# Patient Record
Sex: Female | Born: 1965 | Race: White | Hispanic: No | Smoking: Current every day smoker
Health system: Southern US, Community
[De-identification: ages and names within clinical notes are randomized; demographics above are authoritative.]

---

## 2008-02-16 IMAGING — US US OUTSIDE FILMS BREAST
1 series · 5 of 5 positions shown · non-contrast
Comparison: none

[Series 1: us outside films breast · 5 of 5 slices shown]
[im 1/5]
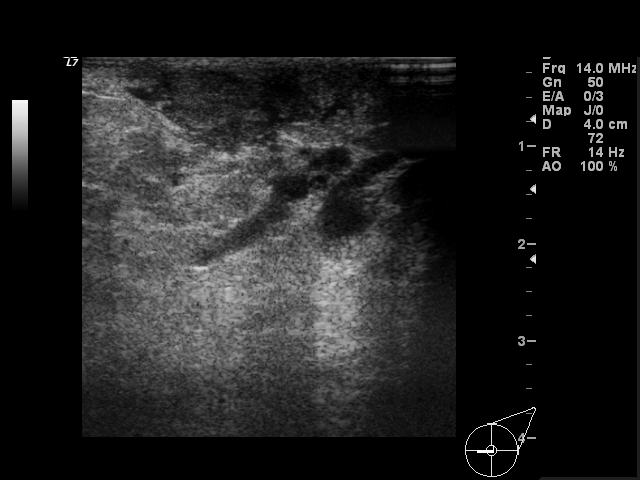
[im 2/5]
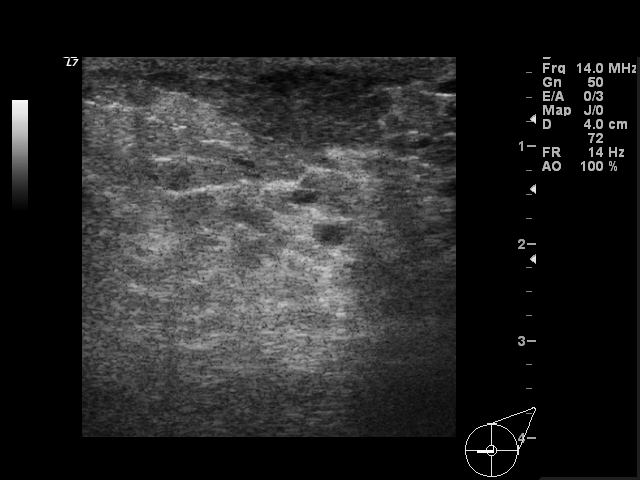
[im 3/5]
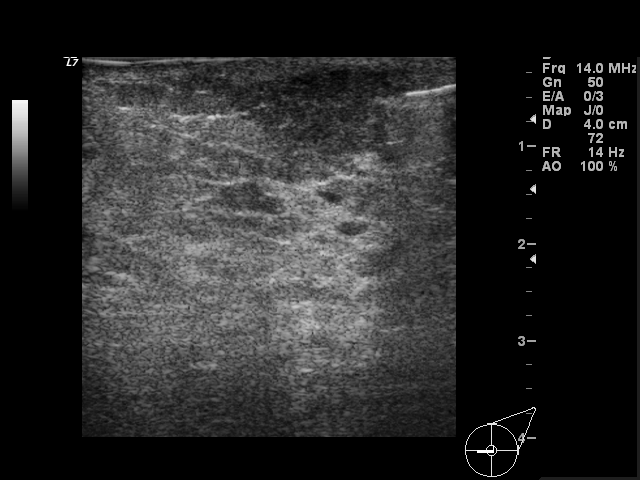
[im 4/5]
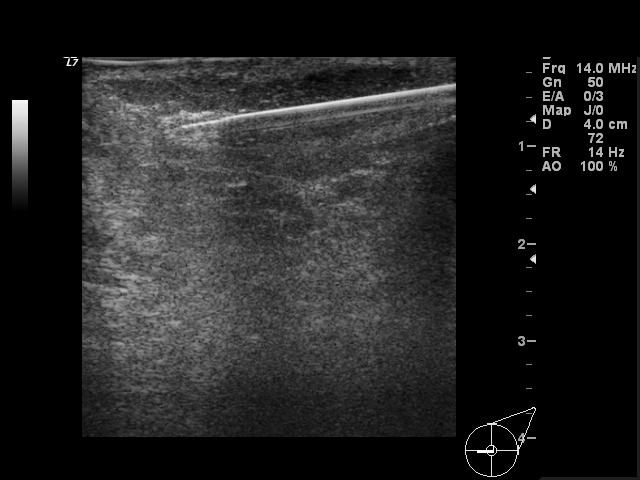
[im 5/5]
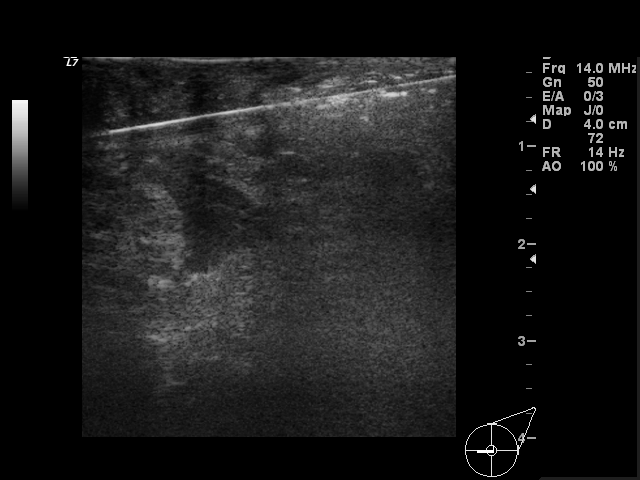

[5 of 5 positions shown; findings below may reference images not displayed]

IMAGES IMPORTED FROM THE SYNGO WORKFLOW SYSTEM
NO DICTATION FOR STUDY

## 2010-03-14 ENCOUNTER — Ambulatory Visit: Payer: Self-pay | Admitting: Internal Medicine

## 2011-06-23 ENCOUNTER — Ambulatory Visit: Payer: Self-pay | Admitting: Internal Medicine

## 2011-06-28 ENCOUNTER — Ambulatory Visit: Payer: Self-pay

## 2012-11-22 IMAGING — US ULTRASOUND LEFT BREAST
1 series · 17 of 24 positions shown · non-contrast
Comparison: none

REASON FOR EXAM: LT BRST LUMP NIPPLE AREA KATUMBE RIPUNDA [phone]
COMMENTS:

PROCEDURE:     US  - US BREAST LEFT  - June 28, 2011 [DATE]
RESULT:     COMPARISON:  09/18/2006 from [REDACTED]
TECHNIQUE: Digital diagnostic mammograms were obtained. FDA approved
computer-aided detection (CAD) for mammography was utilized for this study.
Real-time sonography of the periareolar and retroareolar left breast was
performed at the site of clinical palpable abnormality at approximately the
[DATE] position.

[Series 1: ultrasound left breast · 17 of 24 slices shown]
[im 1/24]
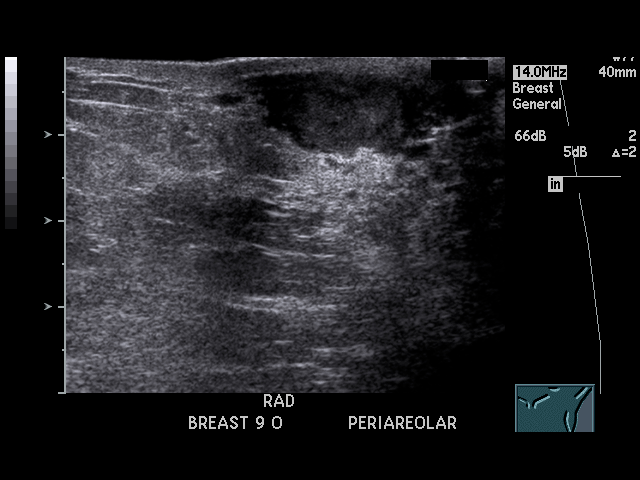
[im 3/24]
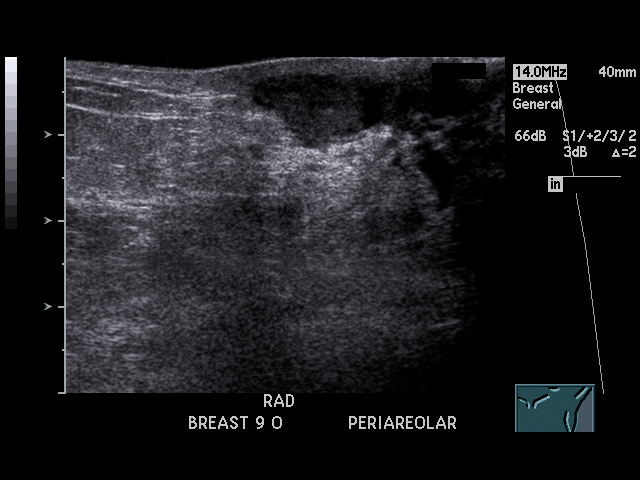
[im 4/24]
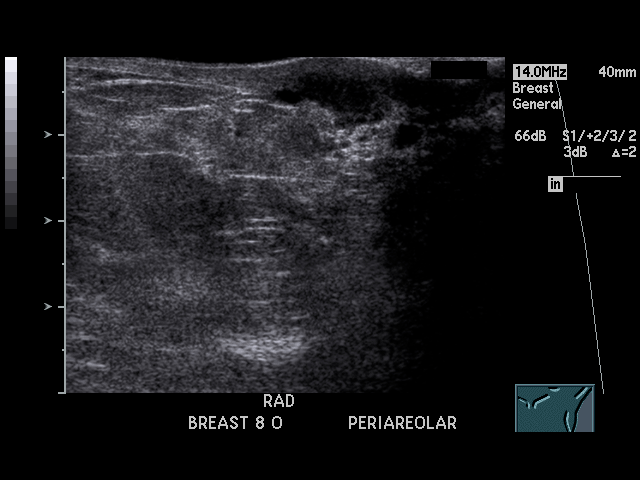
[im 5/24]
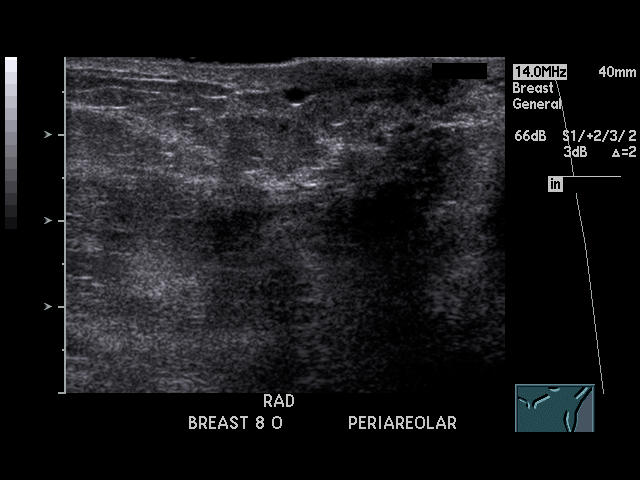
[im 7/24]
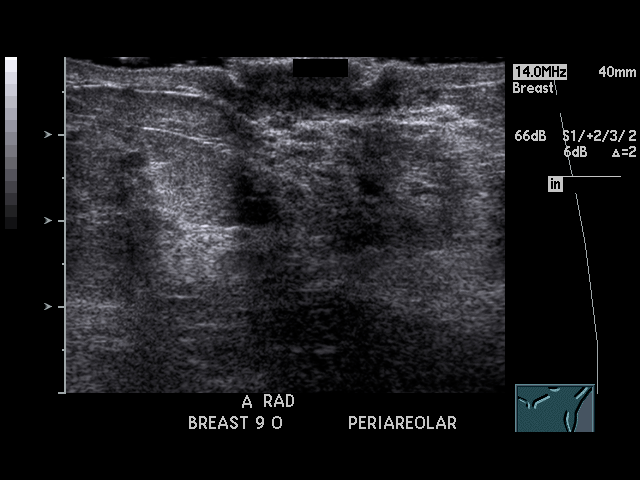
[im 8/24]
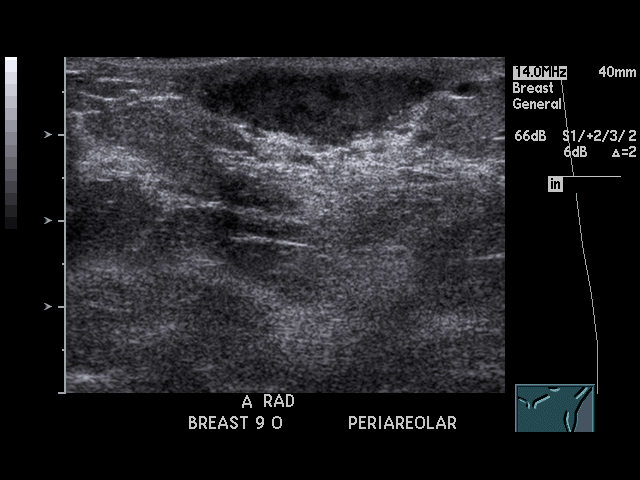
[im 10/24]
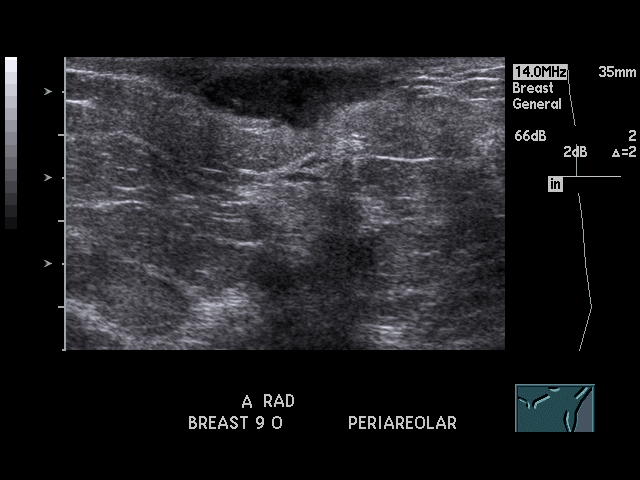
[im 11/24]
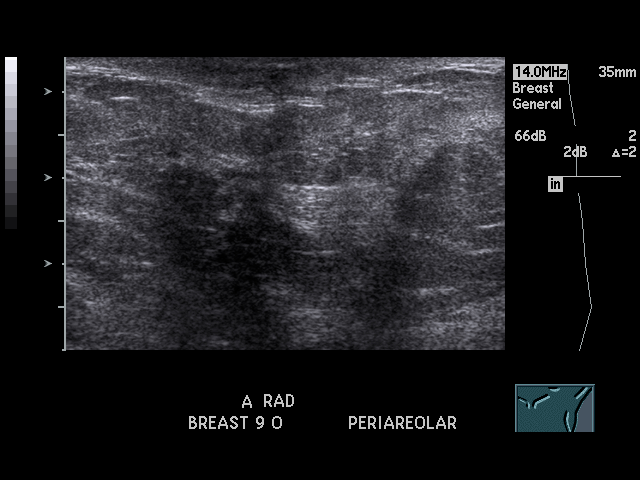
[im 13/24]
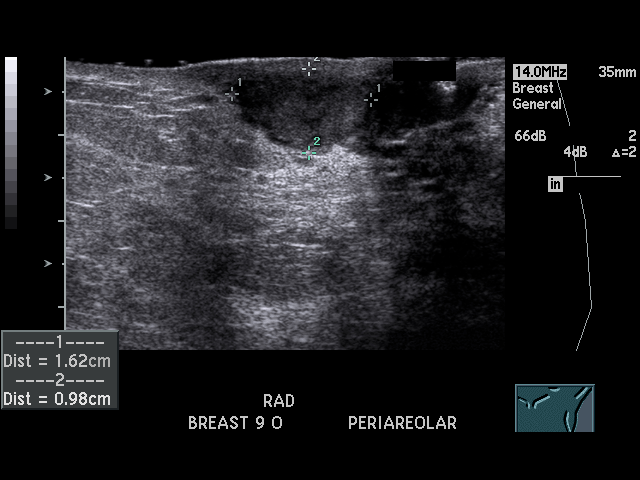
[im 14/24]
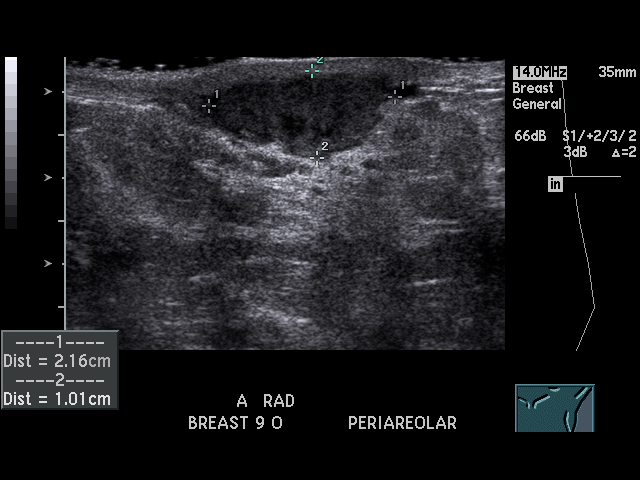
[im 15/24]
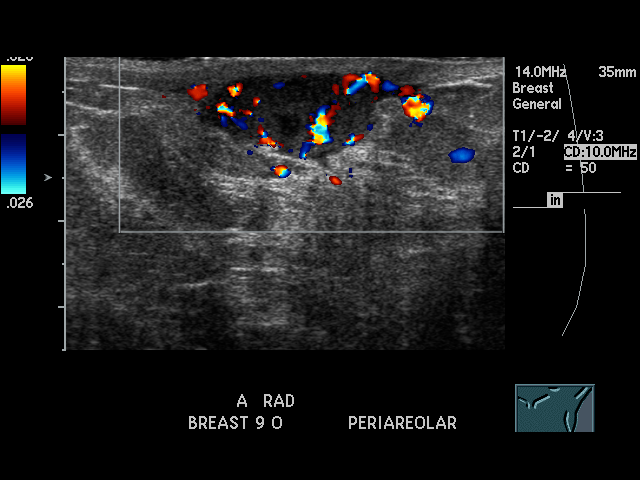
[im 17/24]
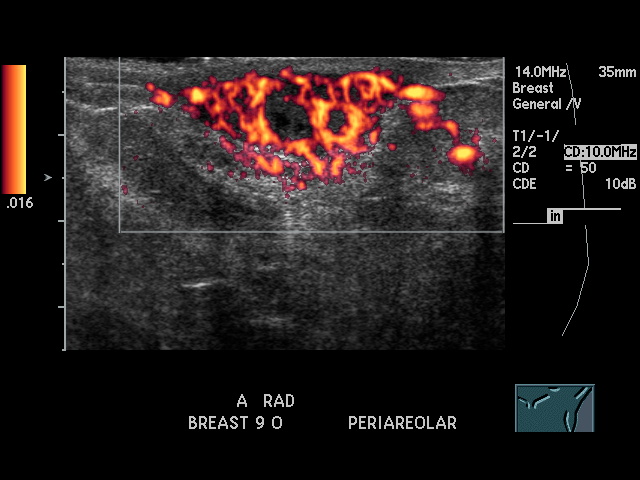
[im 18/24]
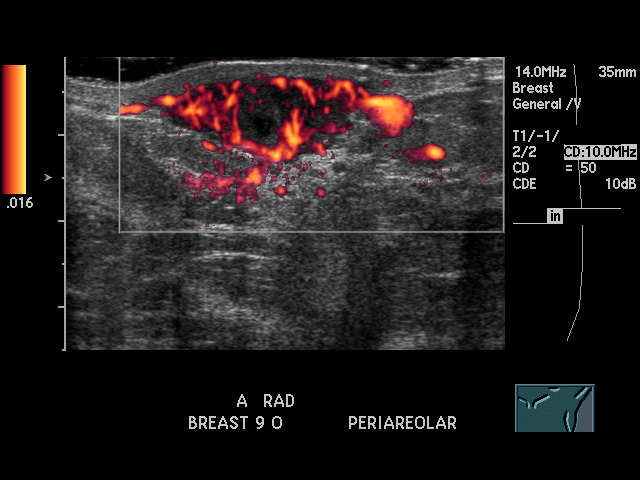
[im 20/24]
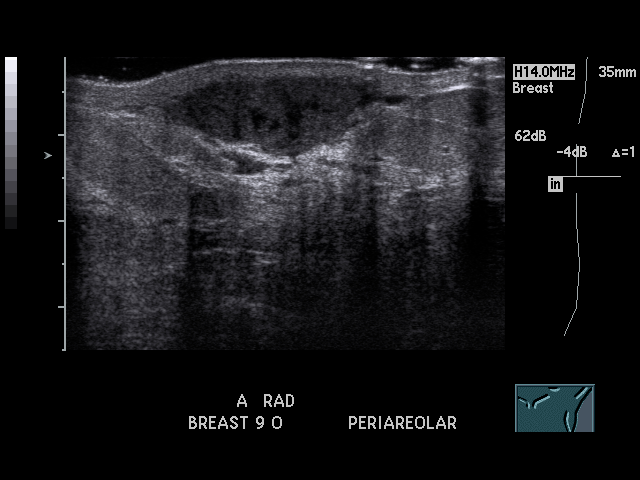
[im 21/24]
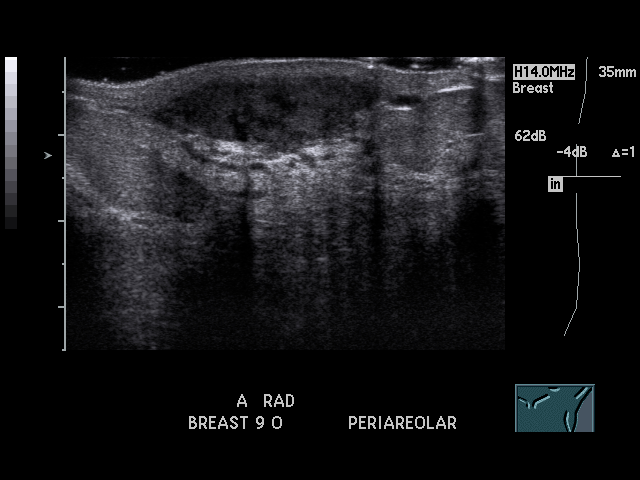
[im 22/24]
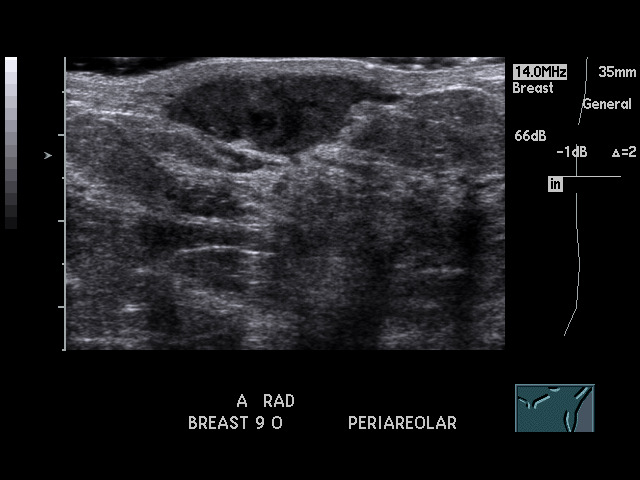
[im 24/24]
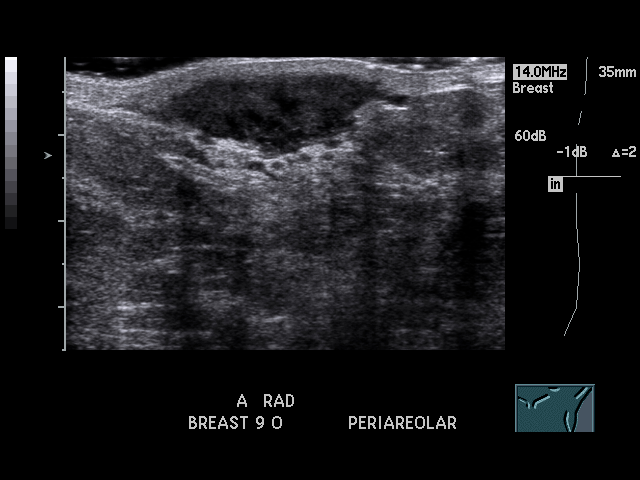

[17 of 24 positions shown; findings below may reference images not displayed]

FINDING: Bilateral breasts are heterogeneously dense which may lower the sensitivity
of mammography. There is slight soft tissue prominence in the left
retroareolar region. There is no other dominant mass, architectural
distortion or clusters of suspicious microcalcifications.

Further evaluation was performed with real-time sonography of the
retroareolar/periareolar left breast at the [DATE] position. In the
periareolar aspect of the left breast at the [DATE] position there is a 2.2 x
1 x 1.6 cm heterogeneously hypoechoic mass with internal Doppler flow. The
margins are well circumscribed. The mass is wider than taller. There is no
significant increased through transmission or posterior acoustic shadowing.
IMPRESSION: 1.     There is a indeterminate mass in the periareolar aspect of the left
breast at the [DATE] position. Tissue diagnosis is recommended.

BI-RADS:  Category 4 - Suspicious Abnormality

A negative mammogram report does not preclude biopsy or other evaluation of
a clinically palpable or otherwise suspicious mass or lesion. Breast cancer
may not be detected by mammography in up to 10% of cases.

[REDACTED]

## 2017-03-07 ENCOUNTER — Encounter: Payer: Self-pay | Admitting: Emergency Medicine

## 2017-03-07 ENCOUNTER — Ambulatory Visit
Admission: EM | Admit: 2017-03-07 | Discharge: 2017-03-07 | Disposition: A | Discharge status: Home / Self Care | Payer: Commercial Managed Care - PPO | Attending: Family Medicine | Admitting: Family Medicine

## 2017-03-07 DIAGNOSIS — M25511 Pain in right shoulder: Secondary | ICD-10-CM

## 2017-03-07 DIAGNOSIS — M75101 Unspecified rotator cuff tear or rupture of right shoulder, not specified as traumatic: Secondary | ICD-10-CM

## 2017-03-07 MED ORDER — PREDNISONE 10 MG (21) PO TBPK: ORAL_TABLET | Freq: Every day | ORAL | 0 refills | Status: DC

## 2017-03-07 NOTE — Discharge Instructions (Signed)
This seems to be from bursitis/rotator cuff tendinitis (and possible tear).  Steroid as prescribed.  Please follow-up with your primary.  Take care  Dr. Adriana Simasook

## 2017-03-07 NOTE — ED Provider Notes (Signed)
[facility]    CSN: [id number] Arrival date & time: 03/07/17  1013   History   Chief Complaint Chief Complaint  Patient presents with  . Arm Pain    right   HPI  [age] year old female presents with right shoulder pain.  Patient states she has had intermittent pain of her right arm/right shoulder since the summer.  She states that it recently worsened/recurred on Sunday.  She reports markedly decreased range of motion, particularly with abduction.  Worse with range of motion.  She been taking Tylenol with no relief.  Her pain is severe, 8-9/10 in severity.  At this point time she has no associated numbness or tingling.  She reports no neck pain.  She does have some pain in the trapezius region.  No other associated symptoms.  No other complaints at this time.  History reviewed. No pertinent past medical history.  History reviewed. No pertinent surgical history.  OB History    No data available     Home Medications    Prior to Admission medications   Medication Sig Start Date End Date Taking? Authorizing Provider  ibuprofen (ADVIL,MOTRIN) 600 MG tablet Take 600 mg every 6 (six) hours as needed by mouth.   Yes [provider]  predniSONE (STERAPRED UNI-PAK 21 TAB) 10 MG (21) TBPK tablet Take daily by mouth. Take 6 tabs by mouth daily  for 2 days, then 5 tabs for 2 days, then 4 tabs for 2 days, then 3 tabs for 2 days, 2 tabs for 2 days, then 1 tab by mouth daily for 2 days 03/07/17   Tommie Samsook, Olin Gurski G, DO   Family History Family History  Problem Relation Age of Onset  . Cancer Mother    Social History Social History   Tobacco Use  . Smoking status: Current Every Day Smoker    Packs/day: 1.00    Types: Cigarettes  . Smokeless tobacco: Never Used  Substance Use Topics  . Alcohol use: Yes  . Drug use: No     Allergies   Patient has no known allergies.   Review of Systems Review of Systems  Musculoskeletal:       Right shoulder pain.  All other  systems reviewed and are negative.  Physical Exam Triage Vital Signs ED Triage Vitals  Enc Vitals Group     BP 03/07/17 1052 (!) 115/95     Pulse Rate 03/07/17 1052 83     Resp 03/07/17 1052 16     Temp 03/07/17 1052 98.4 F (36.9 C)     Temp Source 03/07/17 1052 Oral     SpO2 03/07/17 1052 100 %     Weight 03/07/17 1049 150 lb (68 kg)     Height 03/07/17 1049 5\' 9"  (1.753 m)     Head Circumference --      Peak Flow --      Pain Score 03/07/17 1049 8     Pain Loc --      Pain Edu? --      Excl. in GC? --    Updated Vital Signs BP (!) 115/95 (BP Location: Left Arm)   Pulse 83   Temp 98.4 F (36.9 C) (Oral)   Resp 16   Ht 5\' 9"  (1.753 m)   Wt 150 lb (68 kg)   SpO2 100%   BMI 22.15 kg/m   Physical Exam  Constitutional: She is oriented to person, place, and time. She appears well-developed. No distress.  HENT:  Head: Normocephalic  and atraumatic.  Nose: Nose normal.  Eyes: Conjunctivae are normal. No scleral icterus.  Neck: Normal range of motion.  No discrete posterior neck pain.  Pulmonary/Chest: Effort normal. No respiratory distress. She has no wheezes. She has no rales.  Musculoskeletal:  Shoulder: Right Inspection reveals no abnormalities, atrophy or asymmetry. Palpation is normal with no tenderness over AC joint or bicipital groove. ROM markedly decreased in all planes, particularly abduction and flexion. Rotator cuff strength -4/5 infraspinatus/teres minor and supraspinatus. Positive Hawkins. Painful arc.   Neurological: She is alert and oriented to person, place, and time. No sensory deficit. She exhibits normal muscle tone.  Skin: Skin is warm. No rash noted.  Psychiatric: She has a normal mood and affect. Her behavior is normal.  Vitals reviewed.    UC Treatments / Results  Labs (all labs ordered are listed, but only abnormal results are displayed) Labs Reviewed - No data to display  EKG  EKG Interpretation None       Radiology No results  found.  Procedures Procedures (including critical care time)  Medications Ordered in UC Medications - No data to display   Initial Impression / Assessment and Plan / UC Course  I have reviewed the triage vital signs and the nursing notes.  Pertinent labs & imaging results that were available during my care of the patient were reviewed by me and considered in my medical decision making (see chart for details).     51 year old female presents with shoulder pain.  Appears to be coming from the rotator cuff.  She is also had some cervical issues previously.  Treating with steroids.  Final Clinical Impressions(s) / UC Diagnoses   Final diagnoses:  Rotator cuff syndrome, right    ED Discharge Orders        Ordered    predniSONE (STERAPRED UNI-PAK 21 TAB) 10 MG (21) TBPK tablet  Daily     03/07/17 1145     Controlled Substance Prescriptions  Controlled Substance Registry consulted? Not Applicable   Tommie SamsCook, [place] Prout G, DO 03/07/17 1238

## 2017-03-07 NOTE — ED Triage Notes (Signed)
Patient c/o right arm pain that started a month ago.  Patient was seen at [facility] a month ago for a bone spur.  Patient reports pain in her right shoulder that started on Sunday.

## 2018-05-02 DIAGNOSIS — M0520 Rheumatoid vasculitis with rheumatoid arthritis of unspecified site: Secondary | ICD-10-CM

## 2018-05-02 HISTORY — DX: Rheumatoid vasculitis with rheumatoid arthritis of unspecified site: M05.20

## 2021-10-19 ENCOUNTER — Telehealth: Payer: Self-pay

## 2021-10-19 ENCOUNTER — Ambulatory Visit (INDEPENDENT_AMBULATORY_CARE_PROVIDER_SITE_OTHER): Payer: Self-pay

## 2021-10-19 ENCOUNTER — Ambulatory Visit
Admission: EM | Admit: 2021-10-19 | Discharge: 2021-10-19 | Disposition: A | Discharge status: Home / Self Care | Payer: Self-pay | Attending: Internal Medicine | Admitting: Internal Medicine

## 2021-10-19 DIAGNOSIS — R059 Cough, unspecified: Secondary | ICD-10-CM

## 2021-10-19 DIAGNOSIS — R0789 Other chest pain: Secondary | ICD-10-CM

## 2021-10-19 MED ORDER — FAMCICLOVIR 500 MG PO TABS: 500.0000 mg | ORAL_TABLET | Freq: Three times a day (TID) | ORAL | 0 refills | Status: DC

## 2021-10-19 MED ORDER — FAMCICLOVIR 500 MG PO TABS: 500.0000 mg | ORAL_TABLET | Freq: Three times a day (TID) | ORAL | 0 refills | Status: AC

## 2021-10-19 NOTE — ED Triage Notes (Addendum)
Onset 1 week ago, with left side chest pain. Pt was at the beach last week and thinks she may have pull a muscle.

## 2021-10-19 NOTE — Telephone Encounter (Signed)
Change in pharmacy. Sending her prescription to [facility] pharmacy.

## 2021-10-19 NOTE — Discharge Instructions (Addendum)
No danger signs on exam today.  ECG was unremarkable and chest xray had no left sided findings and no evidence of pneumonia.  There was a tiny lesion in the right lower lung field thought to be scar tissue, that should be rechecked with another chest xray in 6 months.   Possible causes of the left sided chest pain include musculoskeletal (related to beach trip and carrying things/moving a heavy door) and possibly impending shingles.  Prescription for famciclovir (medicine for shingles) was sent to the pharmacy.  Since the skin is so sore, we are treating for possible shingles; watch carefully for rash in the painful areas; blister fluid from the rash is contagious for the chicken pox virus.  Ice and/or heat to the painful areas for 5-10 minutes several times daily may help decrease pain. Diclofenac gel (voltaren; anti inflammatory/pain reliever) applied to the painful areas may also be helpful.  Anticipate gradual improvement over the next 10 days or so.

## 2021-10-19 NOTE — ED Provider Notes (Signed)
[facility]    CSN: [id number] Arrival date & time: 10/19/21  0859      History   Chief Complaint Chief Complaint  Patient presents with   Cough   Flank Pain    HPI Terri Greene is a 56 y.o. female.  She presents today with pain for about the last week in her left anterior and posterior chest.  The skin is also very sore particularly around and on her left breast.  No rash.  She went to the beach last week, and was told in and carrying a lot of items, and also there was a big heavy sliding door that had to be opened and closed with a lot of exertion many times.  Pain in the left chest is particularly noticeable if she uses her arms to push up off a chair from sitting, or when she reaches around to put her seatbelt on, also pressure from the seatbelt is uncomfortable.  Has a little bit of runny nose, little bit of congestion, little bit of cough, not prominent.  No fever.   Cough Flank Pain    Past Medical History:  Diagnosis Date   Rheumatoid arteritis (HCC) 2020    There are no problems to display for this patient.   History reviewed. No pertinent surgical history.  OB History   No obstetric history on file.      Home Medications    Prior to Admission medications   Medication Sig Start Date End Date Taking? Authorizing Provider  famciclovir (FAMVIR) 500 MG tablet Take 1 tablet (500 mg total) by mouth 3 (three) times daily for 7 days. 10/19/21 10/26/21 Yes Isa Rankin, MD  ibuprofen (ADVIL,MOTRIN) 600 MG tablet Take 600 mg every 6 (six) hours as needed by mouth.    [provider]  predniSONE (STERAPRED UNI-PAK 21 TAB) 10 MG (21) TBPK tablet Take daily by mouth. Take 6 tabs by mouth daily  for 2 days, then 5 tabs for 2 days, then 4 tabs for 2 days, then 3 tabs for 2 days, 2 tabs for 2 days, then 1 tab by mouth daily for 2 days 03/07/17   Tommie Sams, DO    Family History Family History  Problem Relation Age of Onset   Cancer Mother      Social History Social History   Tobacco Use   Smoking status: Every Day    Packs/day: 1.00    Types: Cigarettes   Smokeless tobacco: Never  Substance Use Topics   Alcohol use: Yes   Drug use: No     Allergies   Patient has no known allergies.   Review of Systems Review of Systems  Respiratory:  Positive for cough.   Genitourinary:  Positive for flank pain.     Physical Exam Triage Vital Signs ED Triage Vitals  Enc Vitals Group     BP 10/19/21 0934 126/83     Pulse Rate 10/19/21 0934 80     Resp 10/19/21 0934 16     Temp 10/19/21 0934 98.3 F (36.8 C)     Temp src --      SpO2 10/19/21 0934 100 %     Weight --      Height --      Head Circumference --      Peak Flow --      Pain Score 10/19/21 0939 7     Pain Loc --      Pain Edu? --  Excl. in GC? --    No data found.  Updated Vital Signs BP 126/83 (BP Location: Left Arm)   Pulse 80   Temp 98.3 F (36.8 C)   Resp 16   SpO2 100%   Visual Acuity Right Eye Distance:   Left Eye Distance:   Bilateral Distance:    Right Eye Near:   Left Eye Near:    Bilateral Near:     Physical Exam Constitutional:      General: She is not in acute distress.    Appearance: She is not ill-appearing.     Comments: Good hygiene  HENT:     Head: Atraumatic.     Mouth/Throat:     Mouth: Mucous membranes are moist.  Eyes:     Conjunctiva/sclera:     Right eye: Right conjunctiva is not injected. No exudate.    Left eye: Left conjunctiva is not injected. No exudate.    Comments: Conjugate gaze observed  Cardiovascular:     Rate and Rhythm: Normal rate and regular rhythm.  Pulmonary:     Effort: Pulmonary effort is normal. No respiratory distress.     Breath sounds: No wheezing or rhonchi.  Abdominal:     General: There is no distension.  Musculoskeletal:     Cervical back: Neck supple.     Comments: Walked into the urgent care independently Able to raise both arms overhead, internally and externally  rotate  Skin:    General: Skin is warm and dry.     Comments: Did not appreciate any skin lesions or erythema of the anterior posterior left chest.  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     Comments: Face is symmetric, speech is clear, coherent, logical      UC Treatments / Results  Labs (all labs ordered are listed, but only abnormal results are displayed) Labs Reviewed - No data to display  EKG   Radiology DG Chest 2 View  Result Date: 10/19/2021 CLINICAL DATA:  Left chest pain, cough EXAM: CHEST - 2 VIEW COMPARISON:  None Available. FINDINGS: Cardiac size is within normal limits. There are no signs of pulmonary edema. Low position of diaphragms may suggest COPD. There is no focal pulmonary consolidation. There is 4 mm nodular density in the right lower lung fields seen only in the PA view. There is no pleural effusion or pneumothorax. Pleural thickening is seen in both apices. Surgical clips are seen in the right axilla. IMPRESSION: COPD. There are no signs of pulmonary edema or focal pulmonary consolidation. There is 4 mm nodular density in the right lower lung fields, possibly a granuloma. Follow-up chest radiographs in 6 months may be considered to assess stability. Electronically Signed   By: Ernie Avena M.D.   On: 10/19/2021 10:00    Procedures Procedures (including critical care time)  Medications Ordered in UC Medications - No data to display  Initial Impression / Assessment and Plan / UC Course  I have reviewed the triage vital signs and the nursing notes.  Pertinent labs & imaging results that were available during my care of the patient were reviewed by me and considered in my medical decision making (see chart for details).     *** Final Clinical Impressions(s) / UC Diagnoses   Final  diagnoses:  Left-sided chest wall  pain     Discharge Instructions      No danger signs on exam today.  ECG was unremarkable and chest xray had no left sided findings and no evidence of pneumonia.  There was a tiny lesion in the right lower lung field thought to be scar tissue, that should be rechecked with another chest xray in 6 months.   Possible causes of the left sided chest pain include musculoskeletal (related to beach trip and carrying things/moving a heavy door) and possibly impending shingles.  Prescription for famciclovir (medicine for shingles) was sent to the pharmacy.  Since the skin is so sore, we are treating for possible shingles; watch carefully for rash in the painful areas; blister fluid from the rash is contagious for the chicken pox virus.  Ice and/or heat to the painful areas for 5-10 minutes several times daily may help decrease pain. Diclofenac gel (voltaren; anti inflammatory/pain reliever) applied to the painful areas may also be helpful.  Anticipate gradual improvement over the next 10 days or so.     ED Prescriptions     Medication Sig Dispense Auth. Provider   famciclovir (FAMVIR) 500 MG tablet Take 1 tablet (500 mg total) by mouth 3 (three) times daily for 7 days. 21 tablet Isa Rankin, MD      PDMP not reviewed this encounter.

## 2022-07-15 LAB — COLOGUARD: COLOGUARD: NEGATIVE

## 2023-03-16 IMAGING — CR DG CHEST 2V
2 series · 2 of 2 positions shown · non-contrast
Comparison: None Available.

CLINICAL DATA: Left chest pain, cough

EXAM:
CHEST - 2 VIEW

[chest pa]
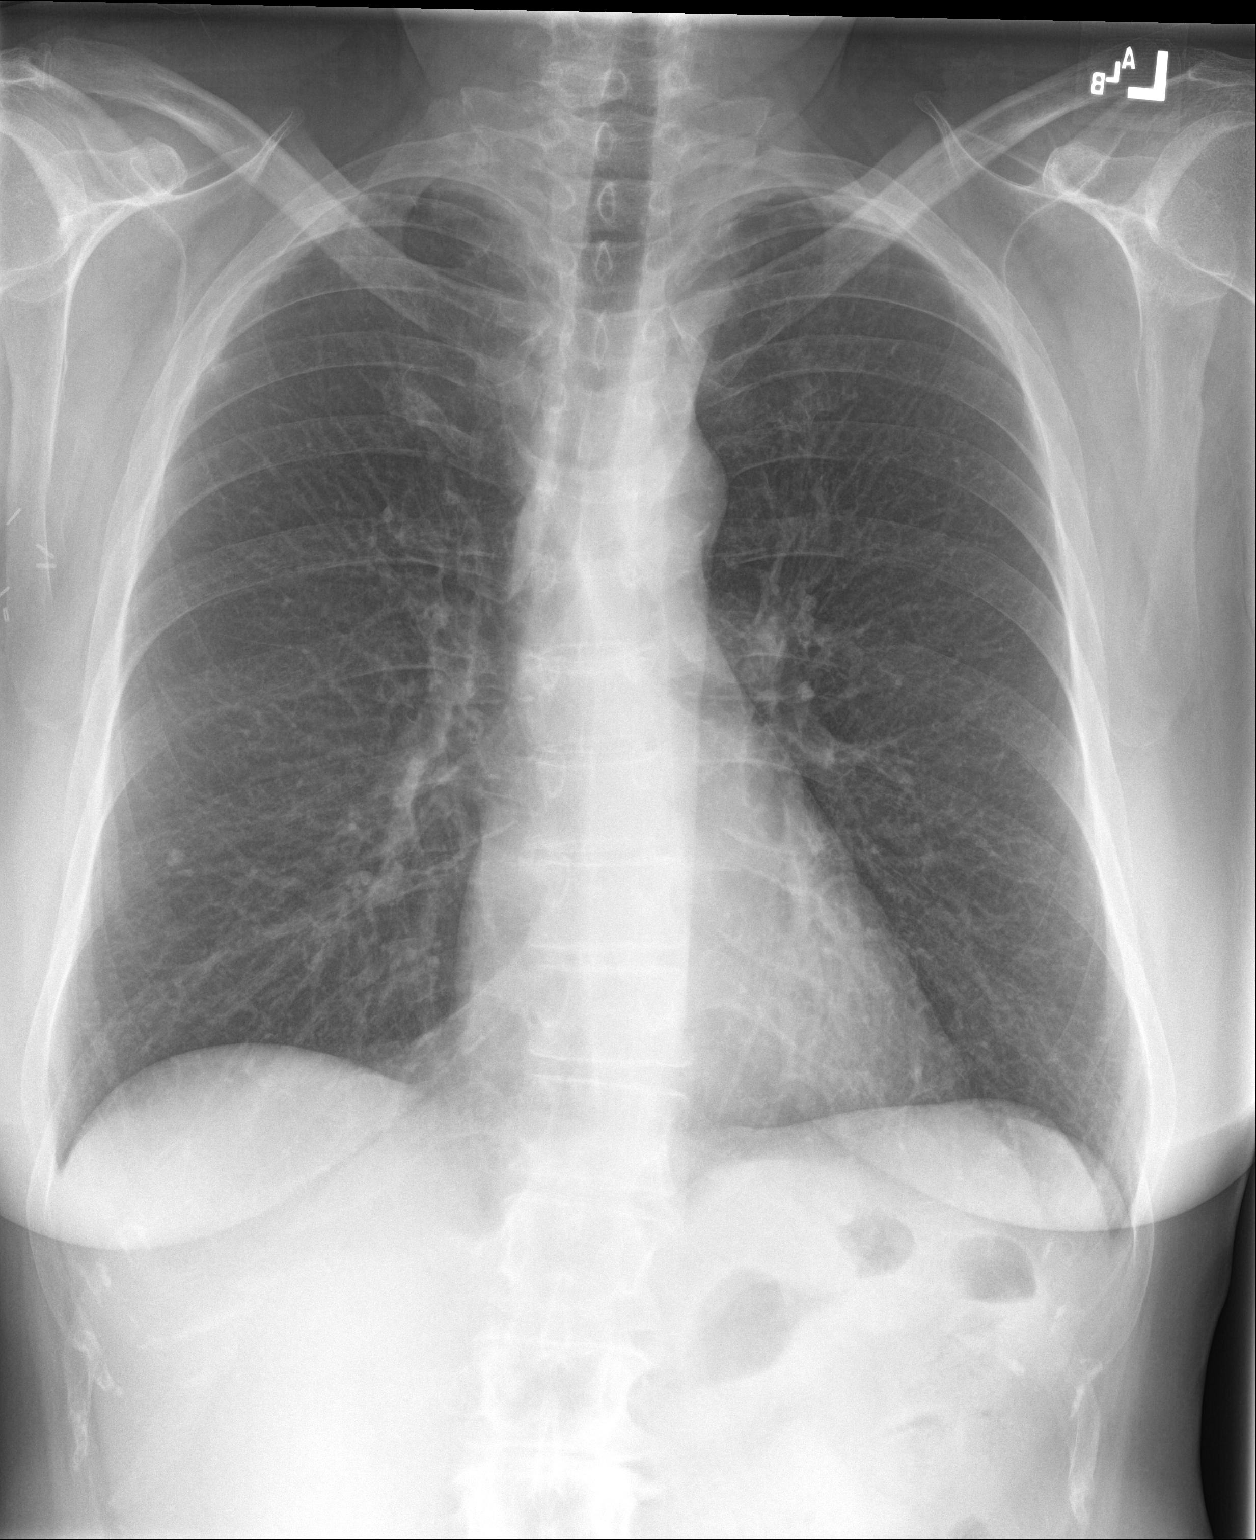

[chest lat]
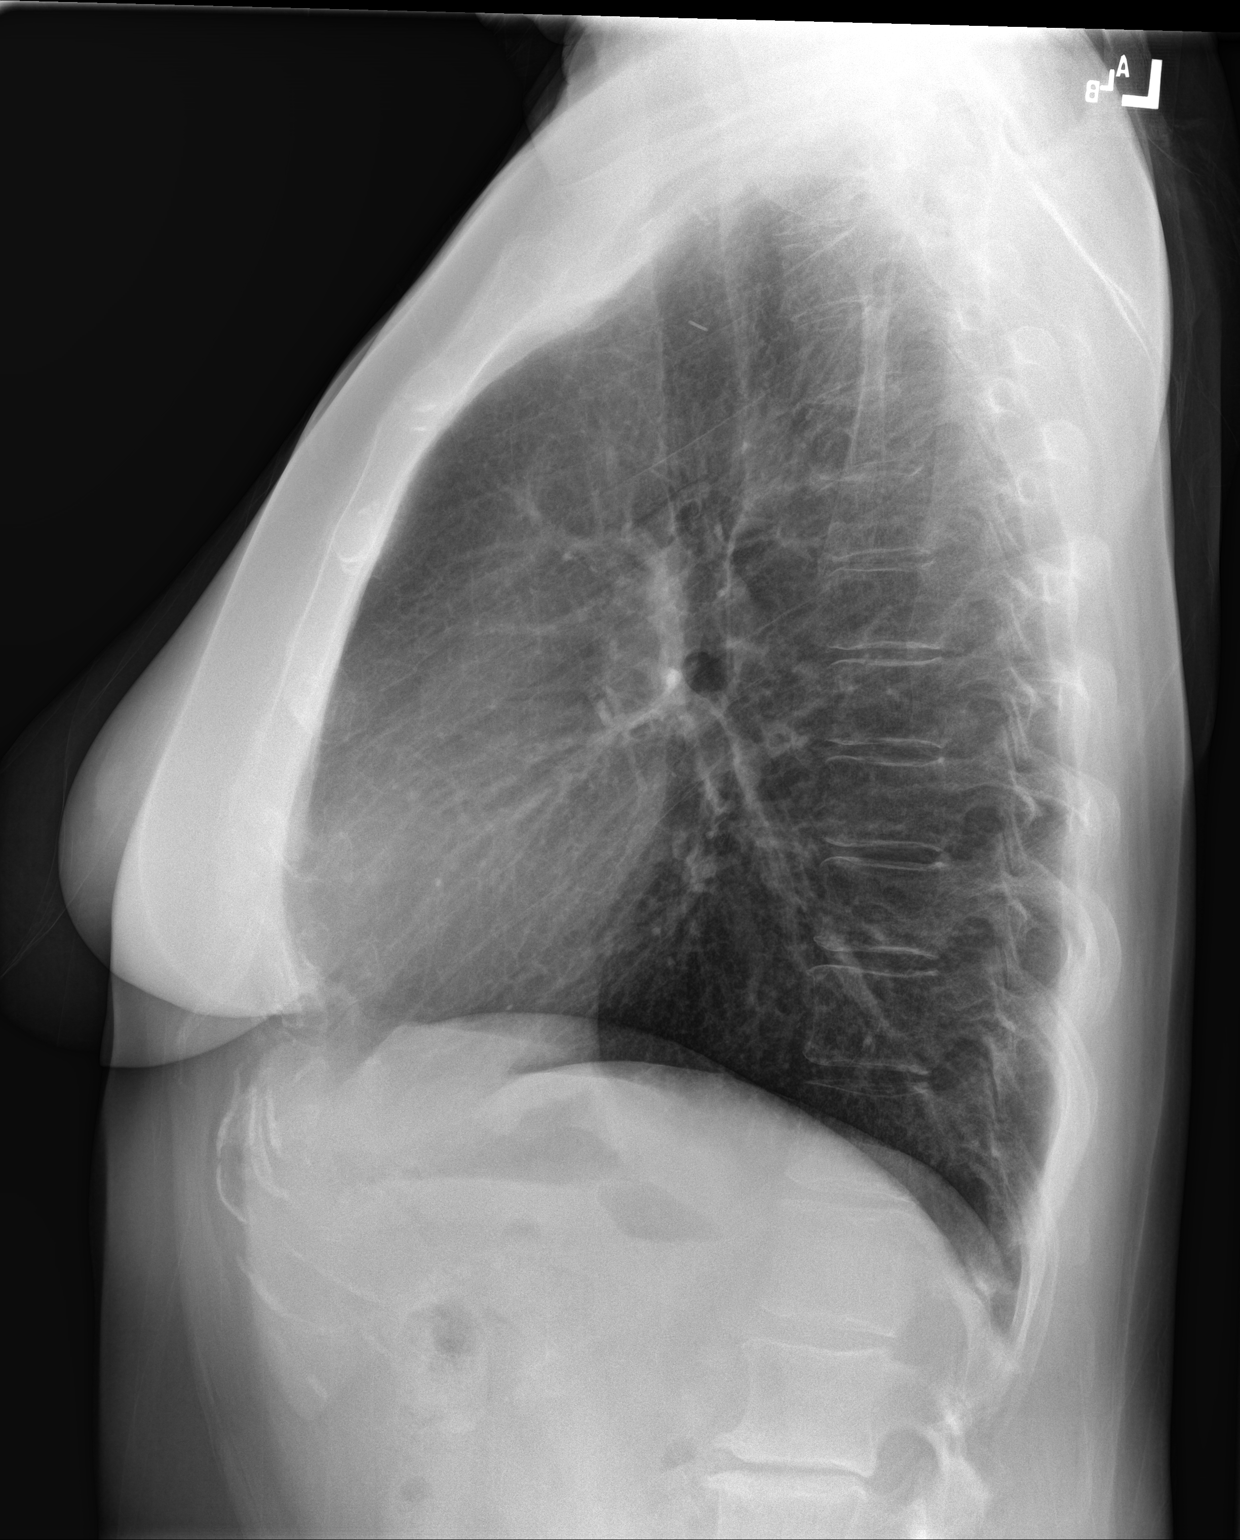

[2 of 2 positions shown; findings below may reference images not displayed]

FINDINGS: Cardiac size is within normal limits. There are no signs of
pulmonary edema. Low position of diaphragms may suggest COPD. There
is no focal pulmonary consolidation. There is 4 mm nodular density
in the right lower lung fields seen only in the PA view. There is no
pleural effusion or pneumothorax. Pleural thickening is seen in both
apices. Surgical clips are seen in the right axilla.
IMPRESSION: COPD. There are no signs of pulmonary edema or focal pulmonary
consolidation.

There is 4 mm nodular density in the right lower lung fields,
possibly a granuloma. Follow-up chest radiographs in 6 months may be
considered to assess stability.

## 2024-05-23 ENCOUNTER — Ambulatory Visit
Admission: EM | Admit: 2024-05-23 | Discharge: 2024-05-23 | Disposition: A | Discharge status: Home / Self Care | Attending: Physician Assistant | Admitting: Physician Assistant

## 2024-05-23 DIAGNOSIS — B349 Viral infection, unspecified: Secondary | ICD-10-CM

## 2024-05-23 DIAGNOSIS — R509 Fever, unspecified: Secondary | ICD-10-CM

## 2024-05-23 DIAGNOSIS — R051 Acute cough: Secondary | ICD-10-CM | POA: Diagnosis not present

## 2024-05-23 LAB — POC SOFIA SARS ANTIGEN FIA: SARS Coronavirus 2 Ag: NEGATIVE

## 2024-05-23 MED ORDER — IPRATROPIUM BROMIDE 0.06 % NA SOLN: 2.0000 | Freq: Four times a day (QID) | NASAL | 0 refills | Status: AC

## 2024-05-23 MED ORDER — PROMETHAZINE-DM 6.25-15 MG/5ML PO SYRP: 5.0000 mL | ORAL_SOLUTION | Freq: Four times a day (QID) | ORAL | 0 refills | Status: AC | PRN

## 2024-05-23 NOTE — Discharge Instructions (Signed)
-  Negative COVID -High possibility of flu  URI/COLD SYMPTOMS: Your exam today is consistent with a viral illness. Antibiotics are not indicated at this time. Use medications as directed, including cough syrup, nasal saline, and decongestants. Your symptoms should improve over the next few days and resolve within 7-10 days. Increase rest and fluids. F/u if symptoms worsen or predominate such as sore throat, ear pain, productive cough, shortness of breath, or if you develop high fevers or worsening fatigue over the next several days.    -Isolate until not feeling feverish for 24 hours and symptoms improving -If fever, worsening cough, increased shortness of breath please return

## 2024-05-23 NOTE — ED Provider Notes (Signed)
 " [facility]    CSN: [id number] Arrival date & time: 05/23/24  1306      History   Chief Complaint Chief Complaint  Patient presents with   Cough    HPI Terri Greene is a 59 y.o. female with of tobacco use and rheumatoid arthritis presenting for fatigue, cough, congestion, chills, vomiting, diarrhea, headaches, and body aches x 4 days. Has shortness of breath but no worse than baseline. Denies fever, sore throat, ear pain, sinus pain, chest pain, wheezing, and abdominal pain. Patient has been taking over-the-counter meds. Daughter sick with flu like symptoms last week. No other complaints.     HPI  Past Medical History:  Diagnosis Date   Rheumatoid arteritis (HCC) 2020    There are no active problems to display for this patient.   History reviewed. No pertinent surgical history.  OB History   No obstetric history on file.      Home Medications    Prior to Admission medications  Medication Sig Start Date End Date Taking? Authorizing Provider  HUMIRA, 2 PEN, 40 MG/0.4ML pen SMARTSIG:40 Milligram(s) SUB-Q Every 2 Weeks   Yes [provider]  ipratropium (ATROVENT ) 0.06 % nasal spray Place 2 sprays into both nostrils 4 (four) times daily. 05/23/24  Yes Arvis Jolan NOVAK, PA-C  methotrexate (RHEUMATREX) 2.5 MG tablet Take 20 mg by mouth once a week.   Yes [provider]  promethazine -dextromethorphan (PROMETHAZINE -DM) 6.25-15 MG/5ML syrup Take 5 mLs by mouth 4 (four) times daily as needed. 05/23/24  Yes Arvis Jolan NOVAK, PA-C  ibuprofen (ADVIL,MOTRIN) 600 MG tablet Take 600 mg every 6 (six) hours as needed by mouth.    [provider]    Family History Family History  Problem Relation Age of Onset   Cancer Mother     Social History Social History[1]   Allergies   Patient has no known allergies.   Review of Systems Review of Systems  Constitutional:  Positive for chills and fatigue. Negative for diaphoresis and fever.   HENT:  Positive for congestion and rhinorrhea. Negative for ear pain, sinus pain and sore throat.   Respiratory:  Positive for cough and shortness of breath.   Cardiovascular:  Negative for chest pain.  Gastrointestinal:  Positive for diarrhea, nausea and vomiting. Negative for abdominal pain.  Musculoskeletal:  Positive for myalgias.  Skin:  Negative for rash.  Neurological:  Positive for headaches. Negative for weakness.  Hematological:  Negative for adenopathy.     Physical Exam Triage Vital Signs ED Triage Vitals  Encounter Vitals Group     BP      Girls Systolic BP Percentile      Girls Diastolic BP Percentile      Boys Systolic BP Percentile      Boys Diastolic BP Percentile      Pulse      Resp      Temp      Temp src      SpO2      Weight      Height      Head Circumference      Peak Flow      Pain Score      Pain Loc      Pain Education      Exclude from Growth Chart    No data found.  Updated Vital Signs BP 100/69 (BP Location: Right Arm)   Pulse (!) 109   Temp 99.6 F (37.6 C) (Oral)  Resp 19   SpO2 97%    Physical Exam Vitals and nursing note reviewed.  Constitutional:      General: She is not in acute distress.    Appearance: Normal appearance. She is ill-appearing. She is not toxic-appearing.  HENT:     Head: Normocephalic and atraumatic.     Nose: Congestion present.     Mouth/Throat:     Mouth: Mucous membranes are moist.     Pharynx: Oropharynx is clear.  Eyes:     General: No scleral icterus.       Right eye: No discharge.        Left eye: No discharge.     Conjunctiva/sclera: Conjunctivae normal.  Cardiovascular:     Rate and Rhythm: Regular rhythm. Tachycardia present.     Heart sounds: Normal heart sounds.  Pulmonary:     Effort: Pulmonary effort is normal. No respiratory distress.     Breath sounds: Normal breath sounds.  Musculoskeletal:     Cervical back: Neck supple.  Skin:    General: Skin is dry.  Neurological:      General: No focal deficit present.     Mental Status: She is alert. Mental status is at baseline.     Motor: No weakness.     Gait: Gait normal.  Psychiatric:        Mood and Affect: Mood normal.        Behavior: Behavior normal.      UC Treatments / Results  Labs (all labs ordered are listed, but only abnormal results are displayed) Labs Reviewed  POC SOFIA SARS ANTIGEN FIA - Normal    EKG   Radiology No results found.  Procedures Procedures (including critical care time)  Medications Ordered in UC Medications - No data to display  Initial Impression / Assessment and Plan / UC Course  I have reviewed the triage vital signs and the nursing notes.  Pertinent labs & imaging results that were available during my care of the patient were reviewed by me and considered in my medical decision making (see chart for details).   59 year old female presents with 4-day history of fatigue, chills, body aches, headaches, cough, congestion, runny nose, nausea/vomiting and diarrhea. Daughter sick with flu like symptoms last week.  She is afebrile.  Current temp 99.6 degrees.  Pulse elevated at 109 bpm.  Ill-appearing but nontoxic.  On exam has nasal congestion.  Throat clear.  Chest clear.  Heart regular rhythm but tachycardic  Rapid COVID test performed. Negative.   Viral illness. Suspect flu. Sent promethazine  DM and atrovent  nasal spray. Reviewed returning if fever, worsening cough, chest pain or increased breathing problems.   Final Clinical Impressions(s) / UC Diagnoses   Final diagnoses:  Acute cough  Viral illness  Feels feverish     Discharge Instructions      -Negative COVID -High possibility of flu  URI/COLD SYMPTOMS: Your exam today is consistent with a viral illness. Antibiotics are not indicated at this time. Use medications as directed, including cough syrup, nasal saline, and decongestants. Your symptoms should improve over the next few days and resolve  within 7-10 days. Increase rest and fluids. F/u if symptoms worsen or predominate such as sore throat, ear pain, productive cough, shortness of breath, or if you develop high fevers or worsening fatigue over the next several days.    -Isolate until not feeling feverish for 24 hours and symptoms improving -If fever, worsening cough, increased shortness of breath please return  ED Prescriptions     Medication Sig Dispense Auth. Provider   promethazine -dextromethorphan (PROMETHAZINE -DM) 6.25-15 MG/5ML syrup Take 5 mLs by mouth 4 (four) times daily as needed. 118 mL Arvis Huxley B, PA-C   ipratropium (ATROVENT ) 0.06 % nasal spray Place 2 sprays into both nostrils 4 (four) times daily. 15 mL Arvis Huxley NOVAK, PA-C      PDMP not reviewed this encounter.     [1]  Social History Tobacco Use   Smoking status: Every Day    Current packs/day: 1.00    Types: Cigarettes   Smokeless tobacco: Never  Substance Use Topics   Alcohol use: Yes   Drug use: No     Arvis Huxley NOVAK, PA-C 05/23/24 1457  "

## 2024-05-23 NOTE — ED Triage Notes (Signed)
 Patient having chills, body aches, cough, runny nose, diarrhea, emesis, and headache onset 4 days ago.   Patient tried Dayquil cold and flu with little relief.
